# Patient Record
Sex: Female | Born: 1975 | Hispanic: Yes | Marital: Single | Smoking: Never smoker
Health system: Southern US, Community
[De-identification: ages and names within clinical notes are randomized; demographics above are authoritative.]

---

## 2006-07-15 ENCOUNTER — Emergency Department: Payer: Self-pay | Admitting: Emergency Medicine

## 2006-07-21 ENCOUNTER — Emergency Department: Payer: Self-pay | Admitting: Emergency Medicine

## 2006-09-24 ENCOUNTER — Ambulatory Visit: Payer: Self-pay | Admitting: Family Medicine

## 2006-12-25 ENCOUNTER — Ambulatory Visit: Payer: Self-pay | Admitting: Family Medicine

## 2007-02-26 ENCOUNTER — Inpatient Hospital Stay: Payer: Self-pay | Admitting: Obstetrics and Gynecology

## 2017-09-02 ENCOUNTER — Ambulatory Visit: Payer: Self-pay | Attending: Oncology

## 2017-09-02 VITALS — BP 131/77 | HR 69 | Temp 99.0°F | Ht 60.0 in | Wt 124.0 lb

## 2017-09-02 DIAGNOSIS — N644 Mastodynia: Secondary | ICD-10-CM

## 2017-09-02 DIAGNOSIS — N63 Unspecified lump in unspecified breast: Secondary | ICD-10-CM

## 2017-09-02 NOTE — Progress Notes (Addendum)
Subjective:     Patient ID: Mallory Fuller, female   DOB: 11-Feb-1976, 41 y.o.   MRN: [id number]  HPI   Review of Systems     Objective:   Physical Exam  Pulmonary/Chest: Right breast exhibits tenderness. Right breast exhibits no inverted nipple, no mass, no nipple discharge and no skin change. Left breast exhibits tenderness. Left breast exhibits no inverted nipple, no mass, no nipple discharge and no skin change. Breasts are asymmetrical.    Left breast larger than right; bilateral UOQ tenderness        Assessment:     41 year old hispanic patient presents for BCCCP clinic visit.  Patient screened, and meets BCCCP eligibility.  Patient does not have insurance, Medicare or Medicaid.  Handout given on Affordable.  Instructed patient on breast self-exam using teach back method Care Act.  CBE reveal bilateral upper outer quadrant tenderness with palpable symmetrical fibroglandular nodularity. Rates 5 on left, and 3 on right on 0-10 scale.  Patient began noticing pain on left about one month ago, and right breast pain began yesterday.  Using breast poster, explained different types of breast tissue, and abnormalities. Patient has never had a mammogram.  Mallory Fuller interpreted exam.    Plan:     Mallory Fuller scheduled patient for bilateral diagnostic mammogram, and ultrasound on 09/10/17 at 1:00.

## 2017-09-10 ENCOUNTER — Ambulatory Visit
Admission: RE | Admit: 2017-09-10 | Discharge: 2017-09-10 | Disposition: A | Discharge status: Home / Self Care | Payer: Self-pay | Source: Ambulatory Visit | Attending: Oncology | Admitting: Oncology

## 2017-09-10 DIAGNOSIS — N644 Mastodynia: Secondary | ICD-10-CM

## 2017-09-10 DIAGNOSIS — N63 Unspecified lump in unspecified breast: Secondary | ICD-10-CM

## 2017-09-14 NOTE — Progress Notes (Signed)
Letter mailed from [facility] to notify of normal mammogram results.  Patient to return in one year for annual screening.  Radiologist discussed benign causes of breast tenderness with patient.  Copy to [facility].

## 2017-10-12 ENCOUNTER — Ambulatory Visit: Payer: Self-pay

## 2018-09-08 ENCOUNTER — Ambulatory Visit: Payer: Self-pay

## 2018-11-17 ENCOUNTER — Ambulatory Visit
Admission: RE | Admit: 2018-11-17 | Discharge: 2018-11-17 | Disposition: A | Discharge status: Home / Self Care | Payer: Self-pay | Source: Ambulatory Visit | Attending: Oncology | Admitting: Oncology

## 2018-11-17 ENCOUNTER — Encounter (INDEPENDENT_AMBULATORY_CARE_PROVIDER_SITE_OTHER): Payer: Self-pay

## 2018-11-17 ENCOUNTER — Ambulatory Visit: Payer: Self-pay | Attending: Oncology

## 2018-11-17 VITALS — BP 129/79 | HR 67 | Temp 98.0°F | Ht 60.0 in | Wt 123.0 lb

## 2018-11-17 DIAGNOSIS — Z0000 Encounter for general adult medical examination without abnormal findings: Secondary | ICD-10-CM

## 2018-11-17 NOTE — Progress Notes (Addendum)
  Subjective:     Patient ID: Mallory Fuller, female   DOB: November 03, 1976, 42 y.o.   MRN: [id number]  HPI   Review of Systems     Objective:   Physical Exam  Pulmonary/Chest: Right breast exhibits no inverted nipple, no mass, no nipple discharge, no skin change and no tenderness. Left breast exhibits no inverted nipple, no mass, no nipple discharge, no skin change and no tenderness. Breasts are symmetrical.       Assessment:     42 [facility] hispanic [facility].  Patient screened, and meets BCCCP eligibility.  Patient does not have insurance, Medicare or Medicaid.  Handout given on Affordable Care Act.   Instructed patient on breast self awareness using teach back method, and demonstration.  Clinical breast exam unremarkable.  No mass or lump palpated.  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  Mallory Fuller interpreted exam.  Risk Assessment    Risk Scores      11/17/2018   Last edited by: Mallory Fuller, Mallory M, RN   5-year risk: 0.4 %   Lifetime risk: 5 %             Plan:     Sent for bilateral screening mammogram.

## 2018-11-18 NOTE — Progress Notes (Signed)
Letter mailed from [facility] to notify of normal mammogram results.  Patient to return in one year for annual screening.  Copy to [facility]. 

## 2019-11-22 ENCOUNTER — Ambulatory Visit: Payer: Self-pay

## 2019-11-29 ENCOUNTER — Telehealth: Payer: Self-pay

## 2019-11-29 NOTE — Telephone Encounter (Signed)
Pre-visit BCCCP call placed - no answer - no msg left. Interpreter Ronnald Collum assisted with the call.

## 2019-11-29 NOTE — Progress Notes (Signed)
Prescreened patient for BCCCP due to COVID.  Denies breast problems.  Sent for Bilateral screening mammogram on 11/30/2019.  Staes she has physical scheduled with Primary pprovider this month and will get pap at the visit.  Maritza Afandor interpreted. Risk Assessment    Risk Scores      11/30/2019 11/17/2018   Last edited by: Theodore Demark, RN Rico Junker, RN   5-year risk: 0.4 % 0.4 %   Lifetime risk: 5 % 5 %

## 2019-11-30 ENCOUNTER — Ambulatory Visit: Payer: Self-pay | Attending: Oncology

## 2019-11-30 ENCOUNTER — Other Ambulatory Visit: Payer: Self-pay

## 2019-11-30 ENCOUNTER — Ambulatory Visit
Admission: RE | Admit: 2019-11-30 | Discharge: 2019-11-30 | Disposition: A | Discharge status: Home / Self Care | Payer: Self-pay | Source: Ambulatory Visit | Attending: Oncology | Admitting: Oncology

## 2019-11-30 DIAGNOSIS — Z0000 Encounter for general adult medical examination without abnormal findings: Secondary | ICD-10-CM | POA: Insufficient documentation

## 2019-11-30 NOTE — Progress Notes (Signed)
Patient arrived today for screening mammogram.  Will follow per BCCCP guidelines.

## 2019-11-30 NOTE — Progress Notes (Signed)
Letter mailed from [facility] to notify of normal mammogram results.  Patient to return in one year for annual screening.  Copy to [facility]. 

## 2020-03-29 ENCOUNTER — Ambulatory Visit: Payer: Self-pay | Attending: Internal Medicine

## 2020-03-29 DIAGNOSIS — Z23 Encounter for immunization: Secondary | ICD-10-CM

## 2020-03-29 NOTE — Progress Notes (Signed)
   Covid-19 Vaccination Clinic  Name:  Anshika Pethtel    MRN: [id number] DOB: 08/07/1976  03/29/2020  Ms. Zarrah Loveland was observed post Covid-19 immunization for 15 minutes without incident. She was provided with Vaccine Information Sheet and instruction to access the V-Safe system.   Ms. Lonie Rummell was instructed to call 911 with any severe reactions post vaccine: Marland Kitchen Difficulty breathing  . Swelling of face and throat  . A fast heartbeat  . A bad rash all over body  . Dizziness and weakness   Immunizations Administered    Name Date Dose VIS Date Route   Pfizer COVID-19 Vaccine 03/29/2020  6:59 PM 0.3 mL 12/09/2019 Intramuscular   Manufacturer: [employer], [employer]   Lot: [id number]   NDC: [address]-6

## 2020-04-20 ENCOUNTER — Ambulatory Visit: Payer: Self-pay | Attending: Internal Medicine

## 2020-04-20 ENCOUNTER — Other Ambulatory Visit: Payer: Self-pay

## 2020-04-20 DIAGNOSIS — Z23 Encounter for immunization: Secondary | ICD-10-CM

## 2020-04-20 NOTE — Progress Notes (Signed)
   Covid-19 Vaccination Clinic  Name:  Mallory Fuller    MRN: [id number] DOB: 1976/01/07  04/20/2020  [name] was observed post Covid-19 immunization for 15 minutes without incident. She was provided with Vaccine Information Sheet and instruction to access the V-Safe system.   [name] was instructed to call 911 with any severe reactions post vaccine: Marland Kitchen Difficulty breathing  . Swelling of face and throat  . A fast heartbeat  . A bad rash all over body  . Dizziness and weakness   Immunizations Administered    Name Date Dose VIS Date Route   Pfizer COVID-19 Vaccine 04/20/2020  6:42 PM 0.3 mL 02/22/2019 Intramuscular   Manufacturer: [employer], [employer]   Lot: [id number]   NDC: [address]-2

## 2020-11-07 ENCOUNTER — Ambulatory Visit
Admission: RE | Admit: 2020-11-07 | Discharge: 2020-11-07 | Disposition: A | Discharge status: Home / Self Care | Payer: Self-pay | Source: Ambulatory Visit | Attending: Oncology | Admitting: Oncology

## 2020-11-07 ENCOUNTER — Ambulatory Visit: Payer: Self-pay | Attending: Oncology

## 2020-11-07 ENCOUNTER — Other Ambulatory Visit: Payer: Self-pay

## 2020-11-07 VITALS — BP 120/78 | HR 66 | Temp 98.0°F | Ht 59.0 in | Wt 127.0 lb

## 2020-11-07 DIAGNOSIS — Z0000 Encounter for general adult medical examination without abnormal findings: Secondary | ICD-10-CM | POA: Insufficient documentation

## 2020-11-08 NOTE — Progress Notes (Signed)
  Subjective:     Patient ID: Mallory Fuller, female   DOB: 1976-09-24, 44 y.o.   MRN: [id number]  HPI   Review of Systems     Objective:   Physical Exam Chest:     Breasts:        Right: No swelling, bleeding, inverted nipple, mass, nipple discharge, skin change or tenderness.        Left: No swelling, bleeding, inverted nipple, mass, nipple discharge, skin change or tenderness.  Genitourinary:    Labia:        Right: No rash, tenderness, lesion or injury.        Left: No rash, tenderness, lesion or injury.      Vagina: No signs of injury and foreign body. No vaginal discharge, erythema, tenderness, bleeding, lesions or prolapsed vaginal walls.     Cervix: No cervical motion tenderness, discharge, friability, lesion, erythema, cervical bleeding or eversion.     Uterus: Not deviated, not enlarged, not fixed, not tender and no uterine prolapse.         Assessment:     44 year old Hispanic patient returns for [facility] clinic visit. Derek Mound from [facility] interpreted exam.  Patient screened, and meets BCCCP eligibility.  Patient does not have insurance, Medicare or Medicaid. Instructed patient on breast self awareness using teach back method.  Clinical breast exam unremarkable.  No mass or lump palpated.  Scant bleeding on pap smear.  Patient states her menstrual cycle is due.   Risk Assessment    Risk Scores      11/07/2020 11/30/2019   Last edited by: Alta Corning, CMA Scarlett Presto, RN   5-year risk: 0.4 % 0.4 %   Lifetime risk: 4.9 % 5 %            Plan:     Sent for bilateral screening mammogram.  Specimen collected for pap.

## 2020-11-10 LAB — IGP, APTIMA HPV: HPV Aptima: NEGATIVE

## 2020-11-15 NOTE — Progress Notes (Signed)
Letter mailed to patient to notify of normal mammogram, and pap smear results. Patient instructed to return for annual screening Copy to [facility].  

## 2021-11-13 ENCOUNTER — Ambulatory Visit
Admission: RE | Admit: 2021-11-13 | Discharge: 2021-11-13 | Disposition: A | Discharge status: Home / Self Care | Payer: Self-pay | Source: Ambulatory Visit | Attending: Oncology | Admitting: Oncology

## 2021-11-13 ENCOUNTER — Ambulatory Visit: Payer: Self-pay | Attending: Oncology

## 2021-11-13 ENCOUNTER — Other Ambulatory Visit: Payer: Self-pay

## 2021-11-13 DIAGNOSIS — Z0000 Encounter for general adult medical examination without abnormal findings: Secondary | ICD-10-CM

## 2021-11-13 NOTE — Progress Notes (Signed)
Patient pre-screened for BCCCP eligibility due to COVID 19 precautions. Two patient identifiers used for verification that I was speaking to correct patient.  Patient to Present directly to [facility] today for BCCCP screening mammogram.  °

## 2021-11-14 NOTE — Progress Notes (Signed)
Letter mailed from [facility] to notify of normal mammogram results.  Patient to return in one year for annual screening.  Copy to [facility]. 

## 2023-02-04 ENCOUNTER — Other Ambulatory Visit: Payer: Self-pay

## 2023-02-04 DIAGNOSIS — Z1231 Encounter for screening mammogram for malignant neoplasm of breast: Secondary | ICD-10-CM

## 2023-02-06 ENCOUNTER — Other Ambulatory Visit: Payer: Self-pay | Admitting: *Deleted

## 2023-02-06 ENCOUNTER — Inpatient Hospital Stay
Admission: RE | Admit: 2023-02-06 | Discharge: 2023-02-06 | Disposition: A | Discharge status: Home / Self Care | Payer: Self-pay | Source: Ambulatory Visit | Attending: *Deleted | Admitting: *Deleted

## 2023-02-06 ENCOUNTER — Encounter: Payer: Self-pay | Admitting: Obstetrics and Gynecology

## 2023-02-06 DIAGNOSIS — Z1231 Encounter for screening mammogram for malignant neoplasm of breast: Secondary | ICD-10-CM

## 2023-03-09 ENCOUNTER — Ambulatory Visit: Payer: Self-pay

## 2023-05-25 ENCOUNTER — Ambulatory Visit: Payer: Self-pay

## 2023-06-01 ENCOUNTER — Ambulatory Visit: Payer: Self-pay

## 2023-08-10 ENCOUNTER — Ambulatory Visit: Payer: Self-pay | Attending: Hematology and Oncology | Admitting: Hematology and Oncology

## 2023-08-10 ENCOUNTER — Ambulatory Visit
Admission: RE | Admit: 2023-08-10 | Discharge: 2023-08-10 | Disposition: A | Discharge status: Home / Self Care | Payer: Self-pay | Source: Ambulatory Visit | Attending: Obstetrics and Gynecology | Admitting: Obstetrics and Gynecology

## 2023-08-10 VITALS — BP 129/76 | Wt 129.9 lb

## 2023-08-10 DIAGNOSIS — Z1231 Encounter for screening mammogram for malignant neoplasm of breast: Secondary | ICD-10-CM

## 2023-08-10 NOTE — Progress Notes (Signed)
[name] is a 47 y.o. female who presents to [facility] clinic today with no complaints.    Pap Smear: Pap not smear completed today. Last Pap smear was 11/07/2020 at [facility] clinic and was normal. Per patient has no history of an abnormal Pap smear. Last Pap smear result is available Epic.   Physical exam: Breasts Breasts symmetrical. No skin abnormalities bilateral breasts. No nipple retraction bilateral breasts. No nipple discharge bilateral breasts. No lymphadenopathy. No lumps palpated bilateral breasts.  MS DIGITAL SCREENING TOMO BILATERAL  Result Date: 11/14/2021 CLINICAL DATA:  Screening. EXAM: DIGITAL SCREENING BILATERAL MAMMOGRAM WITH TOMOSYNTHESIS AND CAD TECHNIQUE: Bilateral screening digital craniocaudal and mediolateral oblique mammograms were obtained. Bilateral screening digital breast tomosynthesis was performed. The images were evaluated with computer-aided detection. COMPARISON:  Previous exam(s). ACR Breast Density Category d: The breast tissue is extremely dense, which lowers the sensitivity of mammography. FINDINGS: There are no findings suspicious for malignancy. IMPRESSION: No mammographic evidence of malignancy. A result letter of this screening mammogram will be mailed directly to the patient. RECOMMENDATION: Screening mammogram in one year. (Code:SM-B-01Y) BI-RADS CATEGORY  1: Negative. Electronically Signed   By: Edwin Cap M.D.   On: 11/14/2021 09:41  MS DIGITAL SCREENING TOMO BILATERAL  Result Date: 11/09/2020 CLINICAL DATA:  Screening. EXAM: DIGITAL SCREENING BILATERAL MAMMOGRAM WITH TOMO AND CAD COMPARISON:  Previous exam(s). ACR Breast Density Category d: The breast tissue is extremely dense, which lowers the sensitivity of mammography FINDINGS: There are no findings suspicious for malignancy. Images were processed with CAD. IMPRESSION: No mammographic evidence of malignancy. A result letter of this screening mammogram will be mailed directly to the  patient. RECOMMENDATION: Screening mammogram in one year. (Code:SM-B-01Y) BI-RADS CATEGORY  1: Negative. Electronically Signed   By: Edwin Cap M.D.   On: 11/09/2020 10:54   MS DIGITAL SCREENING TOMO BILATERAL  Result Date: 11/30/2019 CLINICAL DATA:  Screening. EXAM: DIGITAL SCREENING BILATERAL MAMMOGRAM WITH TOMO AND CAD COMPARISON:  Previous exam(s). ACR Breast Density Category c: The breast tissue is heterogeneously dense, which may obscure small masses. FINDINGS: There are no findings suspicious for malignancy. Images were processed with CAD. IMPRESSION: No mammographic evidence of malignancy. A result letter of this screening mammogram will be mailed directly to the patient. RECOMMENDATION: Screening mammogram in one year. (Code:SM-B-01Y) BI-RADS CATEGORY  1: Negative. Electronically Signed   By: Baird Lyons M.D.   On: 11/30/2019 15:23   MS DIGITAL SCREENING TOMO BILATERAL  Result Date: 11/17/2018 CLINICAL DATA:  Screening. EXAM: DIGITAL SCREENING BILATERAL MAMMOGRAM WITH TOMO AND CAD COMPARISON:  Previous exam(s). ACR Breast Density Category d: The breast tissue is extremely dense, which lowers the sensitivity of mammography. FINDINGS: There are no findings suspicious for malignancy. Images were processed with CAD. IMPRESSION: No mammographic evidence of malignancy. A result letter of this screening mammogram will be mailed directly to the patient. RECOMMENDATION: Screening mammogram in one year. (Code:SM-B-01Y) BI-RADS CATEGORY  1: Negative. Electronically Signed   By: Frederico Hamman M.D.   On: 11/17/2018 16:38         Pelvic/Bimanual Pap is not indicated today    Smoking History: Patient has never smoked and was not referred to quit line.    Patient Navigation: Patient education provided. Access to services provided for patient through BCCCP program. Delos Haring interpreter provided. No transportation provided   Colorectal Cancer Screening: Per patient has never had colonoscopy  completed No complaints today. FIT test completed 06/2023 with results pending.  Breast and Cervical Cancer Risk Assessment: Patient does not have family history of breast cancer, known genetic mutations, or radiation treatment to the chest before age 23. Patient does not have history of cervical dysplasia, immunocompromised, or DES exposure in-utero.  Risk Scores as of Encounter on 08/10/2023     Mallory Fuller           5-year 0.65%   Lifetime 6.72%   This patient is Hisp[place]/Latina but has no documented birth country, so the [place] model used data from [place] patients to calculate their risk score. Document a birth country in the Demographics activity for a more accurate score.         Last calculated by Narda Rutherford, LPN on 1/61/0960 at  3:03 PM        A: BCCCP exam without pap smear No complaints with benign exam.   P: Referred patient to the [facility] for a screening mammogram. Appointment scheduled 08/10/23.  Pascal Lux, NP 08/10/2023 3:14 PM

## 2023-08-10 NOTE — Patient Instructions (Signed)
Taught Mallory Fuller about self breast awareness and gave educational materials to take home. Patient did not need a Pap smear today due to last Pap smear was in 11/07/20 per patient.  Let her know BCCCP will cover Pap smears every 5 years unless has a history of abnormal Pap smears. Referred patient to the [facility] for screening mammogram. Appointment scheduled for 08/10/23. Patient aware of appointment and will be there. Let patient know will follow up with her within the next couple weeks with results. Mallory Fuller verbalized understanding.  Pascal Lux, NP 3:16 PM

## 2023-10-08 IMAGING — MG MM DIGITAL SCREENING BILAT W/ TOMO AND CAD
8 series · 8 of 24 positions shown · non-contrast
Comparison: Previous exam(s).

CLINICAL DATA: Screening.

EXAM:
DIGITAL SCREENING BILATERAL MAMMOGRAM WITH TOMOSYNTHESIS AND CAD
TECHNIQUE: Bilateral screening digital craniocaudal and mediolateral oblique
mammograms were obtained. Bilateral screening digital breast
tomosynthesis was performed. The images were evaluated with
computer-aided detection.

[R CC synth-2D]
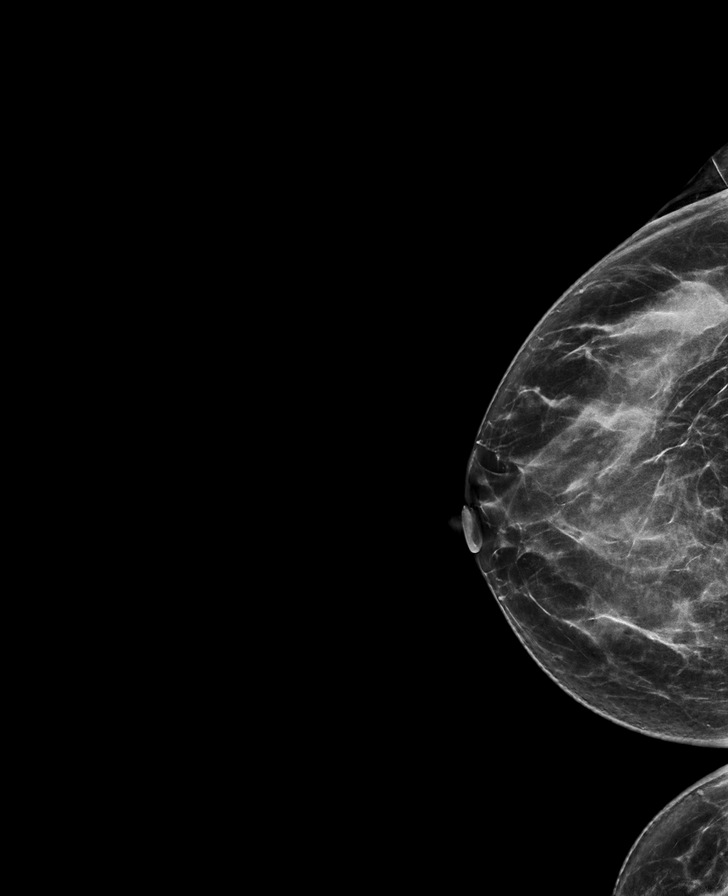

[L CC synth-2D]
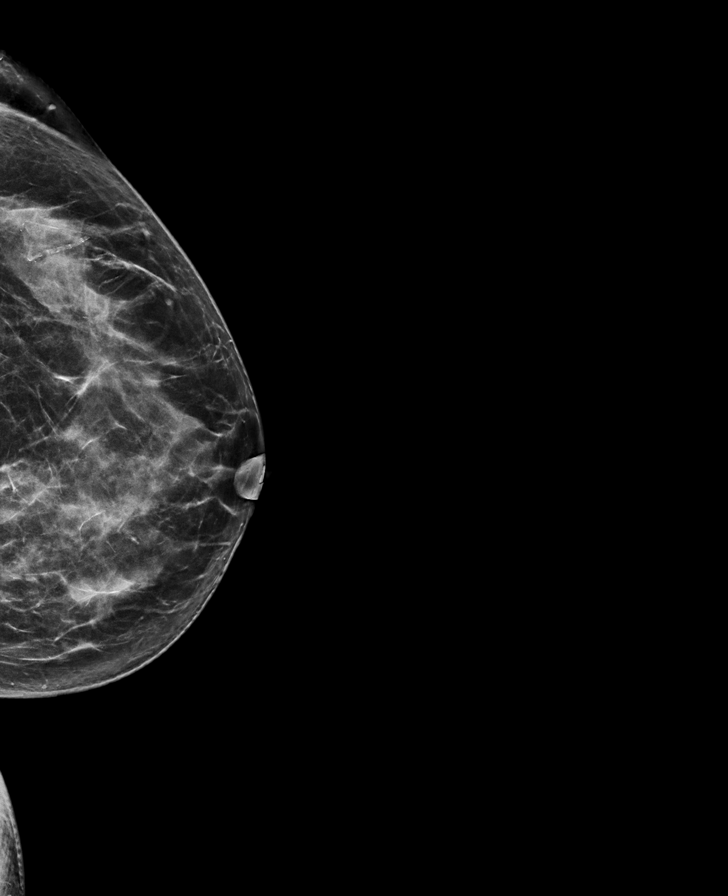

[L MLO synth-2D]
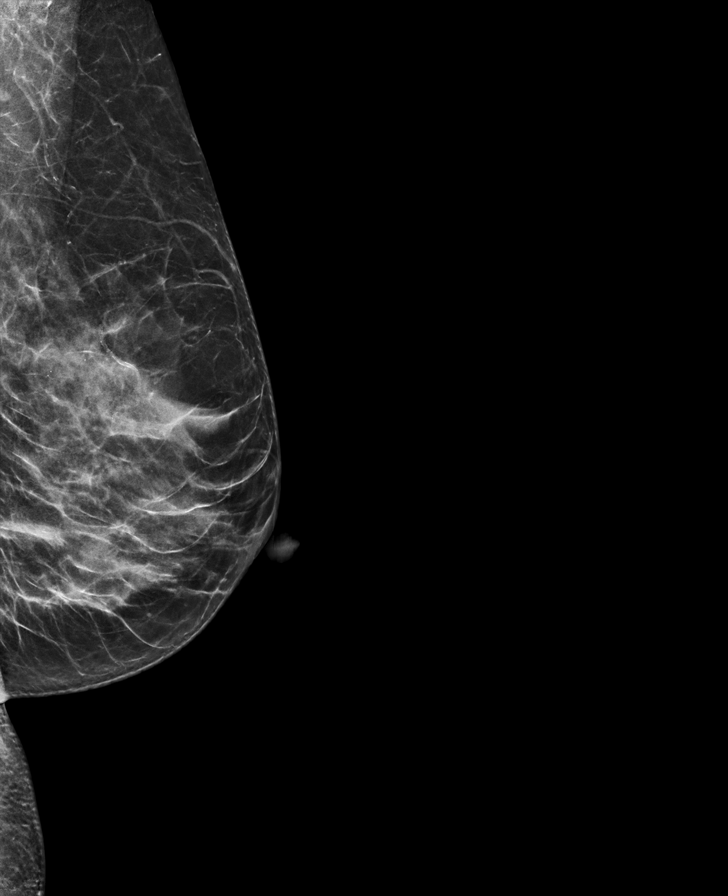

[R MLO synth-2D]
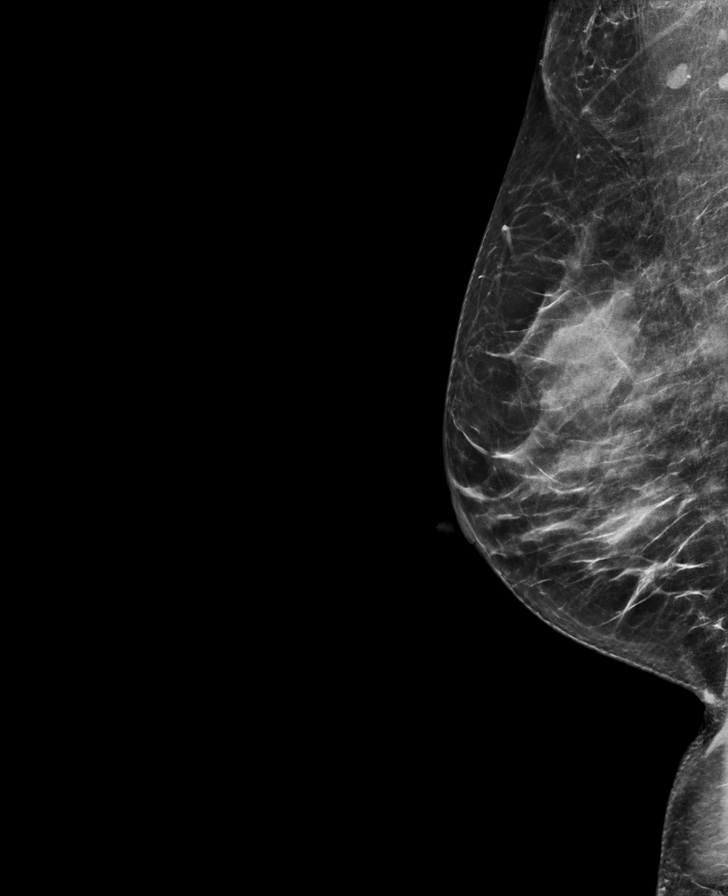

[R MLO tomo · tomo slice 31/62.0]
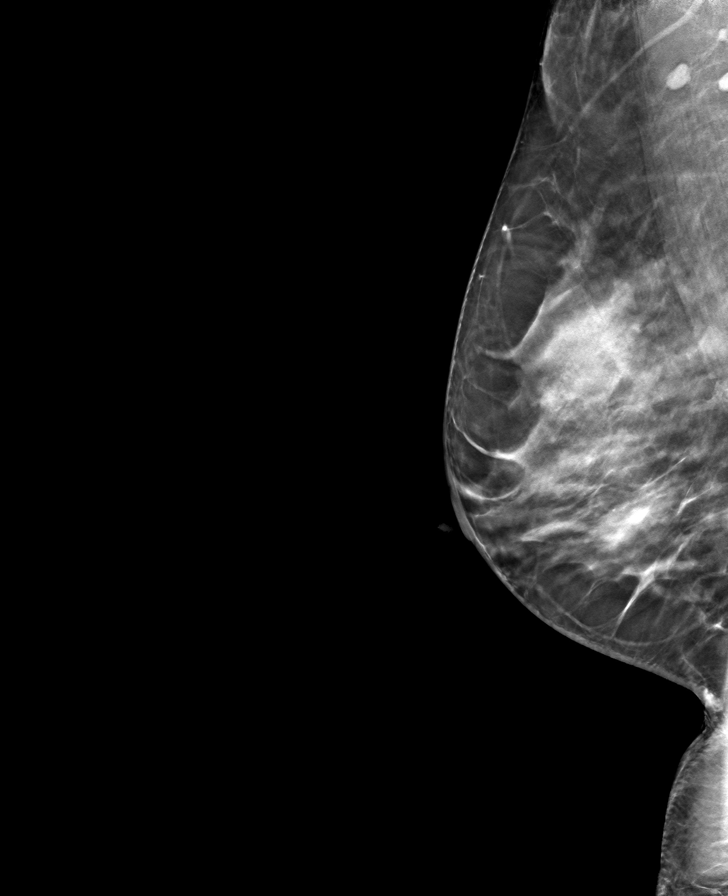

[L CC tomo · tomo slice 33/64.0]
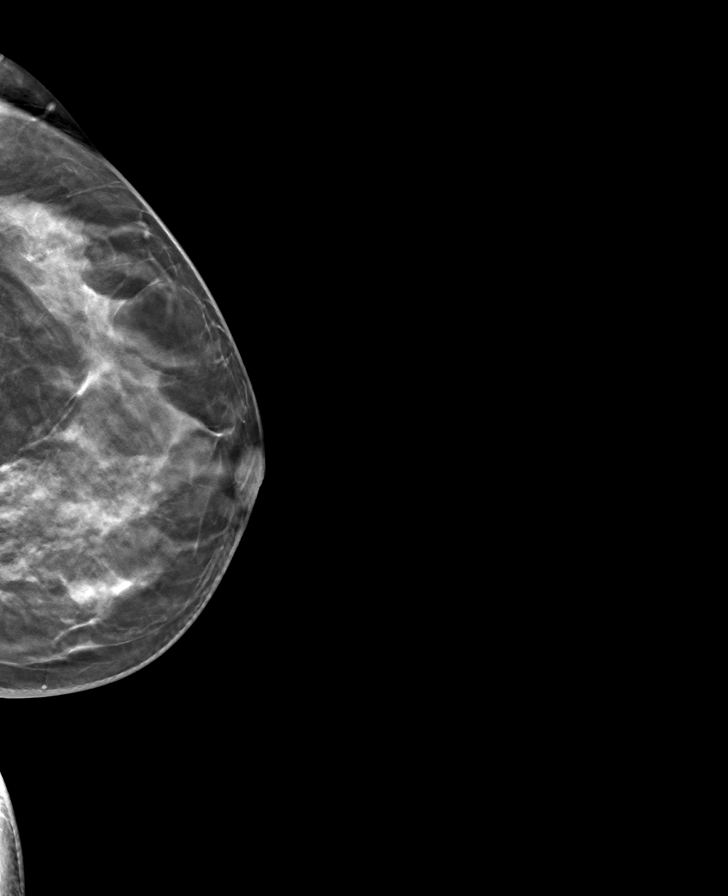

[R CC tomo · tomo slice 33/64.0]
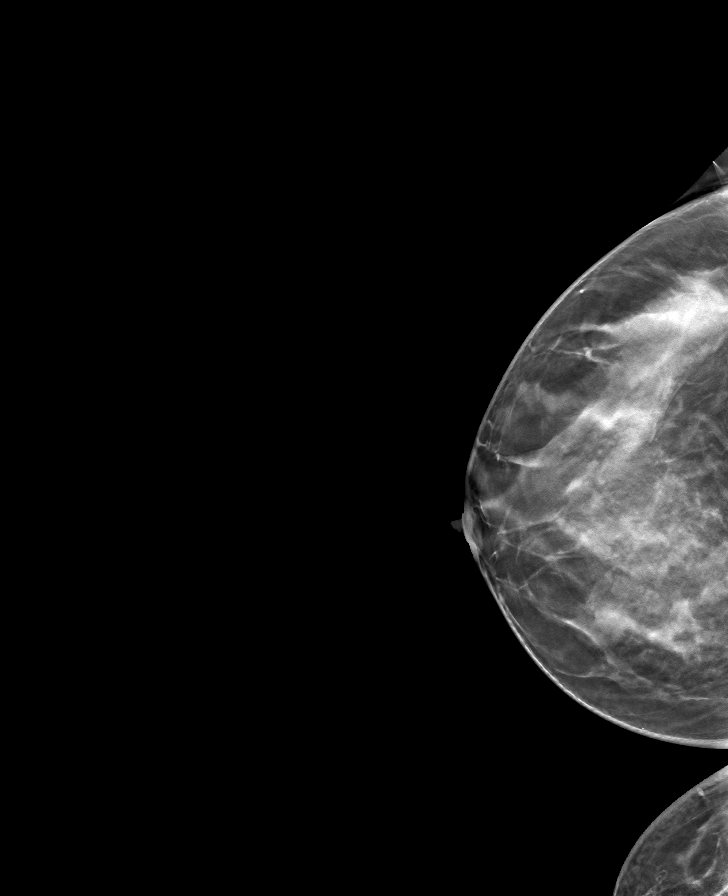

[L MLO tomo · tomo slice 30/59.0]
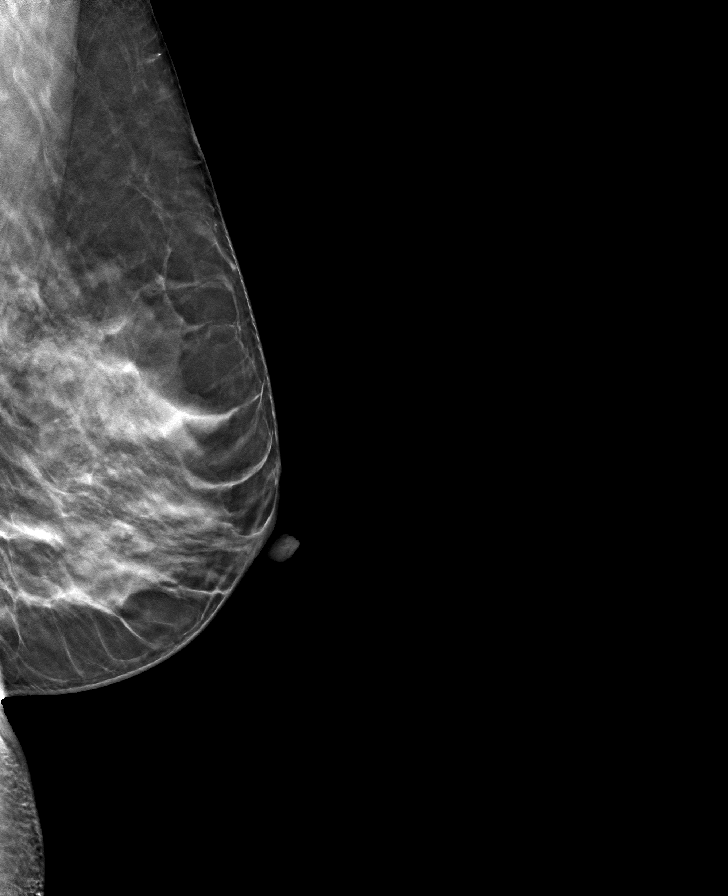

[8 of 24 positions shown; findings below may reference images not displayed]

ACR Breast Density Category d: The breast tissue is extremely dense,
which lowers the sensitivity of mammography.
FINDINGS: There are no findings suspicious for malignancy.
IMPRESSION: No mammographic evidence of malignancy. A result letter of this
screening mammogram will be mailed directly to the patient.

RECOMMENDATION:
Screening mammogram in one year. (Code:BM-N-AXN)

BI-RADS CATEGORY  1: Negative.

## 2024-07-14 ENCOUNTER — Other Ambulatory Visit: Payer: Self-pay

## 2024-07-14 DIAGNOSIS — Z1231 Encounter for screening mammogram for malignant neoplasm of breast: Secondary | ICD-10-CM

## 2024-08-01 ENCOUNTER — Ambulatory Visit: Payer: Self-pay

## 2024-09-12 ENCOUNTER — Ambulatory Visit
Admission: RE | Admit: 2024-09-12 | Discharge: 2024-09-12 | Disposition: A | Discharge status: Home / Self Care | Payer: Self-pay | Source: Ambulatory Visit | Attending: Obstetrics and Gynecology | Admitting: Obstetrics and Gynecology

## 2024-09-12 ENCOUNTER — Ambulatory Visit: Payer: Self-pay | Attending: Obstetrics and Gynecology | Admitting: *Deleted

## 2024-09-12 VITALS — BP 125/80 | Ht 59.0 in | Wt 129.0 lb

## 2024-09-12 DIAGNOSIS — Z1239 Encounter for other screening for malignant neoplasm of breast: Secondary | ICD-10-CM

## 2024-09-12 DIAGNOSIS — Z1231 Encounter for screening mammogram for malignant neoplasm of breast: Secondary | ICD-10-CM | POA: Insufficient documentation

## 2024-09-12 NOTE — Patient Instructions (Signed)
 Explained breast self awareness with Hadassah Kenning. Patient did not need a Pap smear today due to last Pap smear and HPV typing was 11/07/2020. Let her know BCCCP will cover Pap smears and HPV typing every 5 years unless has a history of abnormal Pap smears. Referred patient to the [facility] for a screening mammogram. Appointment scheduled Monday, September 12, 2024 at 1520. Patient aware of appointment and will be there. Let patient know Raymondo will follow up with her within the next couple weeks with results of her mammogram by letter or phone. Shaneisha Mixon verbalized understanding.  Yadriel Kerrigan, Wanda Ship, RN 2:11 PM

## 2024-09-12 NOTE — Progress Notes (Signed)
 Mallory Fuller is a 48 y.o. female who presents to [facility] clinic today with no complaints.    Pap Smear: Pap smear not completed today. Last Pap smear was 11/07/2020 at [facility] clinic and was normal with negative HPV. Per patient has no history of an abnormal Pap smear. Last Pap smear result is available in Epic.   Physical exam: Breasts Left breast slightly larger than right breast that per patient is normal for her. No skin abnormalities bilateral breasts. No nipple retraction bilateral breasts. No nipple discharge bilateral breasts. No lymphadenopathy. No lumps palpated bilateral breasts. No complaints of pain or tenderness on exam.  MS DIGITAL SCREENING TOMO BILATERAL Result Date: 08/12/2023 CLINICAL DATA:  Screening. EXAM: DIGITAL SCREENING BILATERAL MAMMOGRAM WITH TOMOSYNTHESIS AND CAD TECHNIQUE: Bilateral screening digital craniocaudal and mediolateral oblique mammograms were obtained. Bilateral screening digital breast tomosynthesis was performed. The images were evaluated with computer-aided detection. COMPARISON:  Previous exam(s). ACR Breast Density Category d: The breasts are extremely dense, which lowers the sensitivity of mammography. FINDINGS: There are no findings suspicious for malignancy. IMPRESSION: No mammographic evidence of malignancy. A result letter of this screening mammogram will be mailed directly to the patient. RECOMMENDATION: Screening mammogram in one year. (Code:SM-B-01Y) BI-RADS CATEGORY  1: Negative. Electronically Signed   By: Serena  Chacko M.D.   On: 08/12/2023 11:14   MS DIGITAL SCREENING TOMO BILATERAL Result Date: 11/14/2021 CLINICAL DATA:  Screening. EXAM: DIGITAL SCREENING BILATERAL MAMMOGRAM WITH TOMOSYNTHESIS AND CAD TECHNIQUE: Bilateral screening digital craniocaudal and mediolateral oblique mammograms were obtained. Bilateral screening digital breast tomosynthesis was performed. The images were evaluated with computer-aided  detection. COMPARISON:  Previous exam(s). ACR Breast Density Category d: The breast tissue is extremely dense, which lowers the sensitivity of mammography. FINDINGS: There are no findings suspicious for malignancy. IMPRESSION: No mammographic evidence of malignancy. A result letter of this screening mammogram will be mailed directly to the patient. RECOMMENDATION: Screening mammogram in one year. (Code:SM-B-01Y) BI-RADS CATEGORY  1: Negative. Electronically Signed   By: Delon Music M.D.   On: 11/14/2021 09:41  MS DIGITAL SCREENING TOMO BILATERAL Result Date: 11/09/2020 CLINICAL DATA:  Screening. EXAM: DIGITAL SCREENING BILATERAL MAMMOGRAM WITH TOMO AND CAD COMPARISON:  Previous exam(s). ACR Breast Density Category d: The breast tissue is extremely dense, which lowers the sensitivity of mammography FINDINGS: There are no findings suspicious for malignancy. Images were processed with CAD. IMPRESSION: No mammographic evidence of malignancy. A result letter of this screening mammogram will be mailed directly to the patient. RECOMMENDATION: Screening mammogram in one year. (Code:SM-B-01Y) BI-RADS CATEGORY  1: Negative. Electronically Signed   By: Delon Music M.D.   On: 11/09/2020 10:54   MS DIGITAL SCREENING TOMO BILATERAL Result Date: 11/30/2019 CLINICAL DATA:  Screening. EXAM: DIGITAL SCREENING BILATERAL MAMMOGRAM WITH TOMO AND CAD COMPARISON:  Previous exam(s). ACR Breast Density Category c: The breast tissue is heterogeneously dense, which may obscure small masses. FINDINGS: There are no findings suspicious for malignancy. Images were processed with CAD. IMPRESSION: No mammographic evidence of malignancy. A result letter of this screening mammogram will be mailed directly to the patient. RECOMMENDATION: Screening mammogram in one year. (Code:SM-B-01Y) BI-RADS CATEGORY  1: Negative. Electronically Signed   By: Dina  Arceo M.D.   On: 11/30/2019 15:23    Pelvic/Bimanual Pap is not indicated today per  BCCCP guidelines.   Smoking History: Patient has never smoked.  Patient Navigation: Patient education provided. Access to services provided for patient through [place] program. Spanish interpreter Mallory Fuller  from [facility] provided.   Colorectal Cancer Screening: Per patient has never had colonoscopy completed. Per patient completed a Cologuard test in September 2024 that was negative. No complaints today.    Breast and Cervical Cancer Risk Assessment: Patient does not have family history of breast cancer, known genetic mutations, or radiation treatment to the chest before age 74. Patient does not have history of cervical dysplasia, immunocompromised, or DES exposure in-utero.  Risk Scores as of Encounter on 09/12/2024     Mallory Fuller           5-year 0.67%   Lifetime 6.62%   This patient is Hisp[place]/Latina but has no documented birth country, so the [place] model used data from [place] patients to calculate their risk score. Document a birth country in the Demographics activity for a more accurate score.         Last calculated by Mallory Tempie SQUIBB, LPN on 0/84/7974 at  2:07 PM        A: BCCCP exam without pap smear No complaints.  P: Referred patient to the [facility] for a screening mammogram. Appointment scheduled Monday, September 12, 2024 at 1520.  Driscilla Wanda SQUIBB, RN 09/12/2024 2:11 PM
